# Patient Record
Sex: Male | Born: 1960 | Race: Black or African American | Hispanic: No | Marital: Single | Smoking: Never smoker
Health system: Southern US, Community
[De-identification: ages and names within clinical notes are randomized; demographics above are authoritative.]

## PROBLEM LIST (undated history)

## (undated) DIAGNOSIS — I10 Essential (primary) hypertension: Secondary | ICD-10-CM

## (undated) DIAGNOSIS — I2510 Atherosclerotic heart disease of native coronary artery without angina pectoris: Secondary | ICD-10-CM

## (undated) DIAGNOSIS — G4730 Sleep apnea, unspecified: Secondary | ICD-10-CM

## (undated) DIAGNOSIS — I509 Heart failure, unspecified: Secondary | ICD-10-CM

## (undated) HISTORY — PX: CARDIAC DEFIBRILLATOR PLACEMENT: SHX171

---

## 2012-08-12 ENCOUNTER — Emergency Department (HOSPITAL_BASED_OUTPATIENT_CLINIC_OR_DEPARTMENT_OTHER): Payer: Self-pay

## 2012-08-12 ENCOUNTER — Emergency Department (HOSPITAL_BASED_OUTPATIENT_CLINIC_OR_DEPARTMENT_OTHER)
Admission: EM | Admit: 2012-08-12 | Discharge: 2012-08-12 | Disposition: A | Discharge status: Home / Self Care | Payer: Self-pay | Attending: Emergency Medicine | Admitting: Emergency Medicine

## 2012-08-12 ENCOUNTER — Encounter (HOSPITAL_BASED_OUTPATIENT_CLINIC_OR_DEPARTMENT_OTHER): Payer: Self-pay | Admitting: *Deleted

## 2012-08-12 DIAGNOSIS — R05 Cough: Secondary | ICD-10-CM | POA: Insufficient documentation

## 2012-08-12 DIAGNOSIS — R0602 Shortness of breath: Secondary | ICD-10-CM | POA: Insufficient documentation

## 2012-08-12 DIAGNOSIS — I10 Essential (primary) hypertension: Secondary | ICD-10-CM | POA: Insufficient documentation

## 2012-08-12 DIAGNOSIS — Z95810 Presence of automatic (implantable) cardiac defibrillator: Secondary | ICD-10-CM | POA: Insufficient documentation

## 2012-08-12 DIAGNOSIS — R059 Cough, unspecified: Secondary | ICD-10-CM | POA: Insufficient documentation

## 2012-08-12 DIAGNOSIS — E119 Type 2 diabetes mellitus without complications: Secondary | ICD-10-CM | POA: Insufficient documentation

## 2012-08-12 DIAGNOSIS — I2510 Atherosclerotic heart disease of native coronary artery without angina pectoris: Secondary | ICD-10-CM | POA: Insufficient documentation

## 2012-08-12 DIAGNOSIS — Z794 Long term (current) use of insulin: Secondary | ICD-10-CM | POA: Insufficient documentation

## 2012-08-12 DIAGNOSIS — J40 Bronchitis, not specified as acute or chronic: Secondary | ICD-10-CM | POA: Insufficient documentation

## 2012-08-12 HISTORY — DX: Essential (primary) hypertension: I10

## 2012-08-12 HISTORY — DX: Sleep apnea, unspecified: G47.30

## 2012-08-12 HISTORY — DX: Atherosclerotic heart disease of native coronary artery without angina pectoris: I25.10

## 2012-08-12 HISTORY — DX: Heart failure, unspecified: I50.9

## 2012-08-12 LAB — BASIC METABOLIC PANEL
CO2: 27 mEq/L (ref 19–32)
Calcium: 9.2 mg/dL (ref 8.4–10.5)
Chloride: 100 mEq/L (ref 96–112)
GFR calc Af Amer: >90 mL/min (ref >90)
Sodium: 138 mEq/L (ref 135–145)

## 2012-08-12 LAB — CBC WITH DIFFERENTIAL/PLATELET
Basophils Absolute: 0 10*3/uL (ref 0.0–0.1)
Basophils Relative: 0 % (ref 0–1)
Lymphocytes Relative: 21 % (ref 12–46)
MCHC: 34.6 g/dL (ref 30.0–36.0)
Neutro Abs: 6.4 10*3/uL (ref 1.7–7.7)
Platelets: 259 10*3/uL (ref 150–400)
RDW: 12.9 % (ref 11.5–15.5)
WBC: 9.7 10*3/uL (ref 4.0–10.5)

## 2012-08-12 LAB — PRO B NATRIURETIC PEPTIDE: Pro B Natriuretic peptide (BNP): 873.4 pg/mL — ABNORMAL HIGH (ref 0–125)

## 2012-08-12 LAB — TROPONIN I: Troponin I: <0.3 ng/mL (ref <0.30)

## 2012-08-12 MED ORDER — AMOXICILLIN 500 MG PO CAPS: 1000.0000 mg | ORAL_CAPSULE | Freq: Two times a day (BID) | ORAL | Status: AC

## 2012-08-12 MED ORDER — AMOXICILLIN 500 MG PO CAPS
1000.0000 mg | ORAL_CAPSULE | Freq: Once | ORAL | Status: AC
  Administered 2012-08-12: 1000 mg via ORAL
  Filled 2012-08-12: qty 2

## 2012-08-12 MED ORDER — PREDNISONE 20 MG PO TABS: 60.0000 mg | ORAL_TABLET | Freq: Every day | ORAL | Status: AC

## 2012-08-12 MED ORDER — ALBUTEROL SULFATE (5 MG/ML) 0.5% IN NEBU
5.0000 mg | INHALATION_SOLUTION | Freq: Once | RESPIRATORY_TRACT | Status: AC
  Administered 2012-08-12: 5 mg via RESPIRATORY_TRACT
  Filled 2012-08-12: qty 1

## 2012-08-12 MED ORDER — IPRATROPIUM BROMIDE 0.02 % IN SOLN
0.5000 mg | Freq: Once | RESPIRATORY_TRACT | Status: AC
  Administered 2012-08-12: 0.5 mg via RESPIRATORY_TRACT
  Filled 2012-08-12: qty 2.5

## 2012-08-12 MED ORDER — PREDNISONE 50 MG PO TABS
60.0000 mg | ORAL_TABLET | Freq: Once | ORAL | Status: AC
  Administered 2012-08-12: 60 mg via ORAL
  Filled 2012-08-12: qty 1

## 2012-08-12 MED ORDER — ALBUTEROL SULFATE HFA 108 (90 BASE) MCG/ACT IN AERS
2.0000 | INHALATION_SPRAY | RESPIRATORY_TRACT | Status: DC | PRN
  Administered 2012-08-12: 2 via RESPIRATORY_TRACT
  Filled 2012-08-12: qty 6.7

## 2012-08-12 NOTE — ED Notes (Signed)
2 weeks of cough shortness of breath has to sleep sitting up

## 2012-08-12 NOTE — ED Provider Notes (Signed)
History     CSN: [id number]  Arrival date & time 08/12/12  4098   First MD Initiated Contact with Patient 08/12/12 0913      Chief Complaint  Patient presents with  . Cough  . Shortness of Breath    (Consider location/radiation/quality/duration/timing/severity/associated sxs/prior treatment) Patient is a 50 y.o. male presenting with cough and shortness of breath. The history is provided by the patient.  Cough Associated symptoms include shortness of breath.  Shortness of Breath  Associated symptoms include cough and shortness of breath.  He has had a productive cough for the last 2 weeks. Cough is productive of green to yellow sputum. There has been no associated fever or sweats but he has had chills. There's been associated dyspnea. He has had some chest soreness but no true chest pain, heaviness, tightness, pressure. Dyspnea is worse with the coughing paroxysms, but he has not noticed any exertional dyspnea. He has not had any orthopnea, but he states that when he lays down he can only sleep if he is on his stomach. He's tried treating himself with Mucinex with no relief. Symptoms are described as severe.  Past Medical History  Diagnosis Date  . Hypertension   . Diabetes mellitus   . Coronary artery disease   . Congestive heart failure   . Sleep apnea     Past Surgical History  Procedure Date  . Cardiac defibrillator placement   . Cardiac defibrillator placement     History reviewed. No pertinent family history.  History  Substance Use Topics  . Smoking status: Never Smoker   . Smokeless tobacco: Not on file  . Alcohol Use: No      Review of Systems  Respiratory: Positive for cough and shortness of breath.   All other systems reviewed and are negative.    Allergies  Shellfish allergy  Home Medications   Current Outpatient Rx  Name Route Sig Dispense Refill  . ASPIRIN 81 MG PO TABS Oral Take 81 mg by mouth daily.    . ATORVASTATIN CALCIUM 80 MG PO TABS  Oral Take 80 mg by mouth daily.    Marland Kitchen DIGOXIN 0.25 MG PO TABS Oral Take 0.25 mg by mouth daily.    . FUROSEMIDE 40 MG PO TABS Oral Take 40 mg by mouth daily.    Marland Kitchen GLIPIZIDE 5 MG PO TABS Oral Take 5 mg by mouth 2 (two) times daily before a meal.    . GUAIFENESIN ER 600 MG PO TB12 Oral Take 1,200 mg by mouth 2 (two) times daily.    . INSULIN GLARGINE 100 UNIT/ML Jemison SOLN Subcutaneous Inject into the skin at bedtime.    Marland Kitchen METFORMIN HCL 1000 MG PO TABS Oral Take 1,000 mg by mouth 2 (two) times daily with a meal.    . METOPROLOL SUCCINATE ER 100 MG PO TB24 Oral Take 100 mg by mouth daily. Take with or immediately following a meal.    . SPIRONOLACTONE 25 MG PO TABS Oral Take 25 mg by mouth daily.      BP 125/63  Pulse 93  Temp 97.9 F (36.6 C) (Oral)  Resp 24  SpO2 100%  Physical Exam  Nursing note and vitals reviewed. 51year old male, resting comfortably and in no acute distress. Vital signs are significant for tachypnea with respiratory rate 24. Oxygen saturation is 100%, which is normal. Head is normocephalic and atraumatic. PERRLA, EOMI. Oropharynx is clear. Neck is nontender and supple without adenopathy or JVD. Back is nontender and  there is no CVA tenderness. Lungs have rales at the left base, and also have diffuse inspiratory and expiratory wheezes. Chest is nontender. Pacemaker-defibrillator is present in the left subclavian area Heart has regular rate and rhythm without murmur. Abdomen is soft, flat, nontender without masses or hepatosplenomegaly and peristalsis is normoactive. Extremities have no cyanosis or edema, full range of motion is present. Skin is warm and dry without rash. Neurologic: Mental status is normal, cranial nerves are intact, there are no motor or sensory deficits.   ED Course  Procedures (including critical care time)  Results for orders placed during the hospital encounter of 08/12/12  CBC WITH DIFFERENTIAL      Component Value Range   WBC 9.7  4.0 -  10.5 K/uL   RBC 4.42  4.22 - 5.81 MIL/uL   Hemoglobin 12.8 (*) 13.0 - 17.0 g/dL   HCT 40.9 (*) 81.1 - 91.4 %   MCV 83.7  78.0 - 100.0 fL   MCH 29.0  26.0 - 34.0 pg   MCHC 34.6  30.0 - 36.0 g/dL   RDW 78.2  95.6 - 21.3 %   Platelets 259  150 - 400 K/uL   Neutrophils Relative 66  43 - 77 %   Neutro Abs 6.4  1.7 - 7.7 K/uL   Lymphocytes Relative 21  12 - 46 %   Lymphs Abs 2.0  0.7 - 4.0 K/uL   Monocytes Relative 10  3 - 12 %   Monocytes Absolute 1.0  0.1 - 1.0 K/uL   Eosinophils Relative 4  0 - 5 %   Eosinophils Absolute 0.3  0.0 - 0.7 K/uL   Basophils Relative 0  0 - 1 %   Basophils Absolute 0.0  0.0 - 0.1 K/uL  BASIC METABOLIC PANEL      Component Value Range   Sodium 138  135 - 145 mEq/L   Potassium 3.5  3.5 - 5.1 mEq/L   Chloride 100  96 - 112 mEq/L   CO2 27  19 - 32 mEq/L   Glucose, Bld 114 (*) 70 - 99 mg/dL   BUN 13  6 - 23 mg/dL   Creatinine, Ser 0.86  0.50 - 1.35 mg/dL   Calcium 9.2  8.4 - 57.8 mg/dL   GFR calc non Af Amer >90  >90 mL/min   GFR calc Af Amer >90  >90 mL/min  PRO B NATRIURETIC PEPTIDE      Component Value Range   Pro B Natriuretic peptide (BNP) 873.4 (*) 0 - 125 pg/mL  TROPONIN I      Component Value Range   Troponin I <0.30  <0.30 ng/mL   Dg Chest 2 View  08/12/2012  *RADIOLOGY REPORT*  Clinical Data: Cough.  Chills.  Shortness of breath.  History of hypertension, diabetes, coronary artery disease, sleep apnea.  CHEST - 2 VIEW  Comparison: None.  Findings: Cardiac silhouette moderately enlarged.  Thoracic aorta minimally atherosclerotic.  Hilar and mediastinal contours otherwise unremarkable.  Left subclavian pacing defibrillator with lead tips at the expected location of the right atrial appendage and right ventricular apex.  Lungs clear.  Bronchovascular markings normal.  Pulmonary vascularity normal.  No pneumothorax.  No pleural effusions.  Visualized bony thorax intact.  IMPRESSION: Cardiomegaly.  No acute cardiopulmonary disease.   Original Report  Authenticated By: Arnell Sieving, M.D.       Date: 08/12/2012  Rate: 97  Rhythm: normal sinus rhythm  QRS Axis: left  Intervals: normal  ST/T Wave  abnormalities: J-point elevation in the anterior leads  Conduction Disutrbances:nonspecific intraventricular conduction delay  Narrative Interpretation: Left ventricular hypertrophy with QRS widening and repolarization abnormality. Old anteroseptal myocardial infarction. Left axis deviation. No prior ECG available for comparison.  Old EKG Reviewed: none available    1. Bronchitis       MDM  Bronchitis versus pneumonia. Given his history of congestive heart failure, ECG and BNP will be checked. He states that his ejection fraction is 23%. Chest x-ray or be obtained to rule out pneumonia. He will be given albuterol with Atrovent and reassessed.  1015: Reexam after albuterol with Atrovent. He states subjective improvement. On exam, wheezing has decreased slightly but is still present. Albuterol with Atrovent will be repeated. Chest x-ray does not show evidence of infiltrate.  1110: Reexam after second nebulizer treatment. He has noted significant additional subjective improvement. On exam, there are still very faint, wheezes or present. You'll be given a third albuterol with Atrovent treatment.  1210: Reexam after third nebulizer treatment shows completely clear lungs. His blood sugar has come back is slightly over 100, so I feel that he can tolerate a short course of prednisone. He will be sent home with prescriptions for prednisone, amoxicillin, and he is given an albuterol inhaler to take home. [facility].  Dione Booze, MD 08/12/12 1220

## 2012-08-12 NOTE — ED Notes (Signed)
Patient transported to X-ray 

## 2016-11-06 ENCOUNTER — Other Ambulatory Visit: Payer: Self-pay | Admitting: Orthopaedic Surgery

## 2016-11-06 DIAGNOSIS — M5416 Radiculopathy, lumbar region: Secondary | ICD-10-CM

## 2016-11-18 ENCOUNTER — Other Ambulatory Visit: Payer: Self-pay | Admitting: Orthopaedic Surgery

## 2016-11-18 DIAGNOSIS — M5416 Radiculopathy, lumbar region: Secondary | ICD-10-CM

## 2016-11-19 ENCOUNTER — Ambulatory Visit
Admission: RE | Admit: 2016-11-19 | Discharge: 2016-11-19 | Disposition: A | Discharge status: Home / Self Care | Payer: Worker's Compensation | Source: Ambulatory Visit | Attending: Orthopaedic Surgery | Admitting: Orthopaedic Surgery

## 2016-11-19 DIAGNOSIS — M5416 Radiculopathy, lumbar region: Secondary | ICD-10-CM

## 2017-11-11 IMAGING — CT CT L SPINE W/O CM
1 of 6 series · 6 of 14 positions shown, 8 images · non-contrast
Comparison: None.

CLINICAL DATA: Lumbar radiculopathy

EXAM:
CT LUMBAR SPINE WITHOUT CONTRAST
TECHNIQUE: Multidetector CT imaging of the lumbar spine was performed without
intravenous contrast administration. Multiplanar CT image
reconstructions were also generated.

[Series 3: l spine soft · axial · 0.30mm/px · z∈[+856,+1036]mm · 6 of 85 slices shown, 8 images]
[im 13/85  soft-tissue]
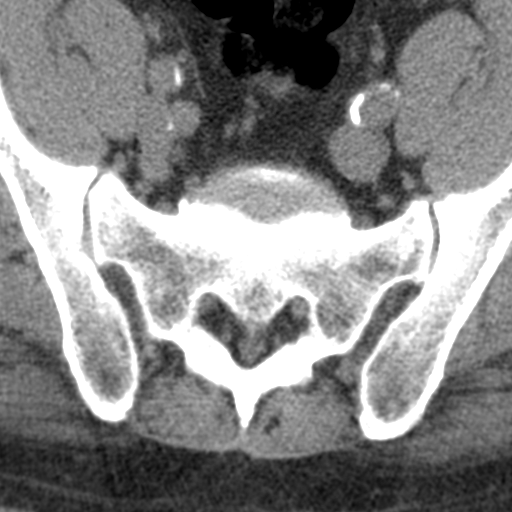
[im 13/85  bone]
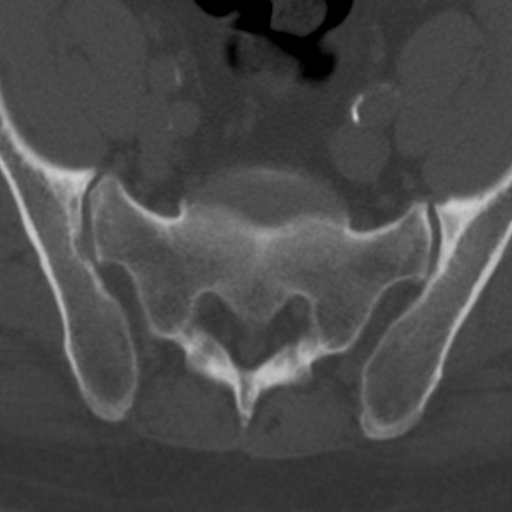
[im 25/85  bone]
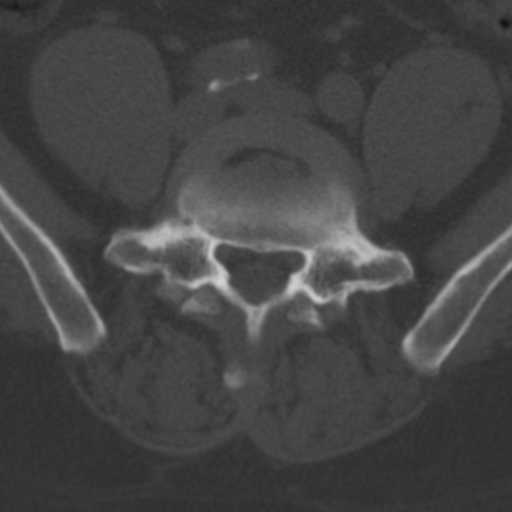
[im 37/85  bone]
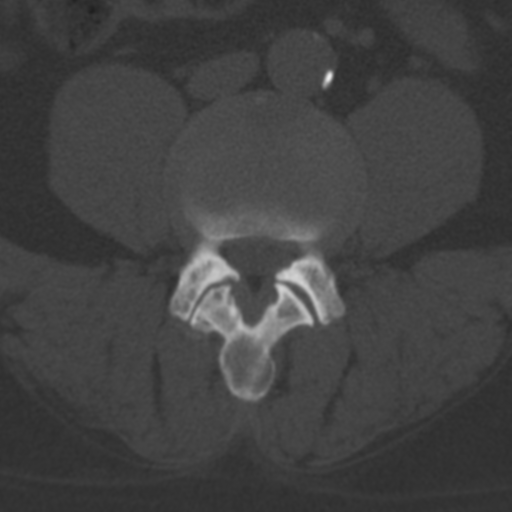
[im 49/85  bone]
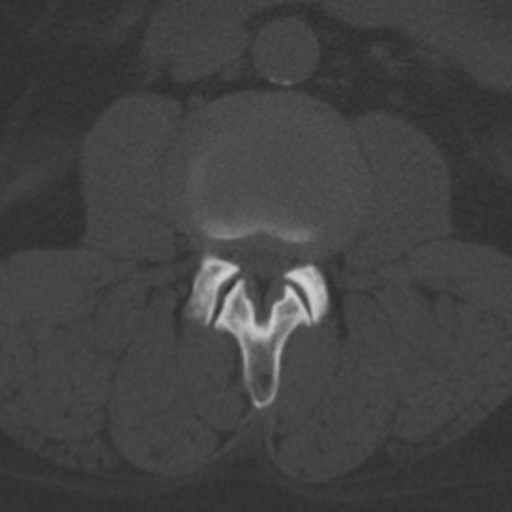
[im 61/85  soft-tissue]
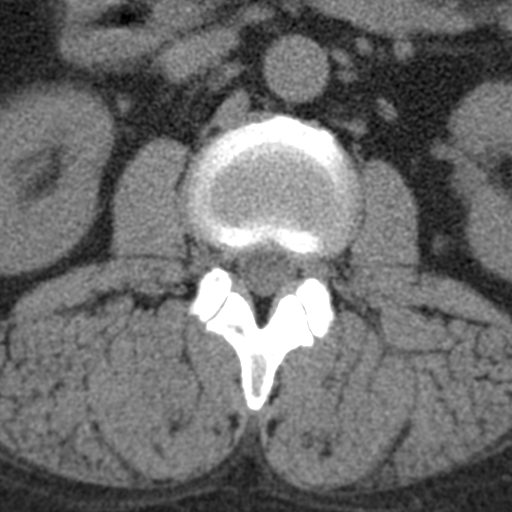
[im 61/85  bone]
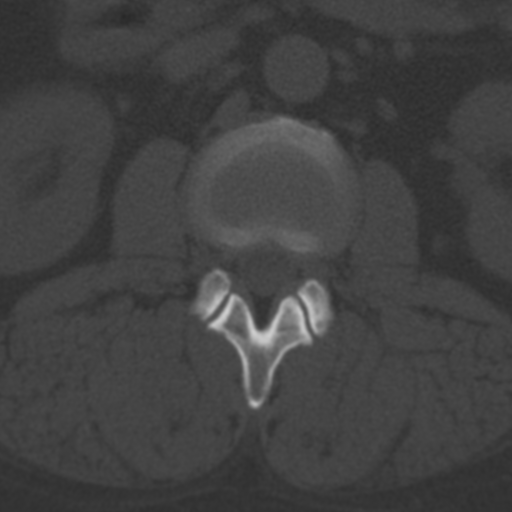
[im 73/85  bone]
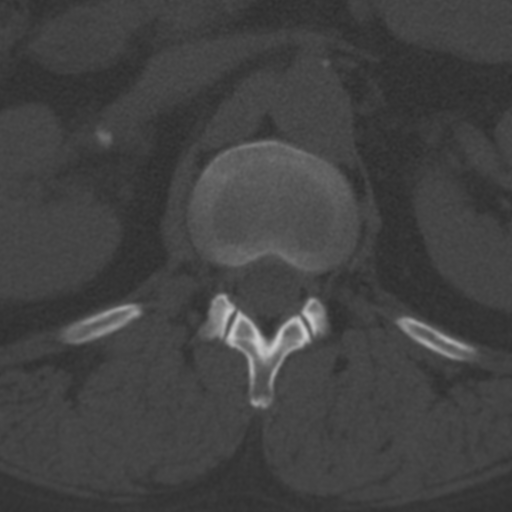

[6 of 14 positions shown; findings below may reference images not displayed]

FINDINGS: Segmentation: Normal

Alignment: Mild retrolisthesis L4-5.  Remaining alignment normal.

Vertebrae: Negative for fracture or mass.

Paraspinal and other soft tissues: Paraspinous muscles normal. No
retroperitoneal mass or adenopathy. Atherosclerotic calcification in
the aorta and iliac arteries without aneurysm. Mild atherosclerotic
calcification in the renal artery bilaterally.

Disc levels: L1-2:  Mild disc degeneration without stenosis

L2-3: Moderate disc bulging and moderate facet hypertrophy. Moderate
spinal stenosis. Neural foramina patent.

L3-4: Moderate diffuse bulging of the disc. Moderate facet and
ligamentum flavum hypertrophy causing moderate spinal stenosis.
Neural foramina adequately patent

L4-5: Disc degeneration. Diffuse disc bulging with endplate
spurring. Mild facet degeneration. Moderate spinal stenosis.
Subarticular stenosis bilaterally.

L5-S1:  Negative
IMPRESSION: Disc and facet degeneration causing moderate spinal stenosis at
L2-3, L3-4, L4-5.

## 2019-01-01 ENCOUNTER — Other Ambulatory Visit: Payer: Self-pay

## 2019-01-01 ENCOUNTER — Emergency Department (HOSPITAL_COMMUNITY): Payer: No Typology Code available for payment source

## 2019-01-01 ENCOUNTER — Encounter (HOSPITAL_COMMUNITY): Payer: Self-pay

## 2019-01-01 ENCOUNTER — Emergency Department (HOSPITAL_COMMUNITY)
Admission: EM | Admit: 2019-01-01 | Discharge: 2019-01-01 | Disposition: A | Discharge status: Home / Self Care | Payer: No Typology Code available for payment source | Attending: Emergency Medicine | Admitting: Emergency Medicine

## 2019-01-01 DIAGNOSIS — I509 Heart failure, unspecified: Secondary | ICD-10-CM | POA: Insufficient documentation

## 2019-01-01 DIAGNOSIS — E875 Hyperkalemia: Secondary | ICD-10-CM | POA: Insufficient documentation

## 2019-01-01 DIAGNOSIS — E119 Type 2 diabetes mellitus without complications: Secondary | ICD-10-CM | POA: Insufficient documentation

## 2019-01-01 DIAGNOSIS — Z794 Long term (current) use of insulin: Secondary | ICD-10-CM | POA: Insufficient documentation

## 2019-01-01 DIAGNOSIS — I110 Hypertensive heart disease with heart failure: Secondary | ICD-10-CM | POA: Insufficient documentation

## 2019-01-01 DIAGNOSIS — Z79899 Other long term (current) drug therapy: Secondary | ICD-10-CM | POA: Insufficient documentation

## 2019-01-01 DIAGNOSIS — I2510 Atherosclerotic heart disease of native coronary artery without angina pectoris: Secondary | ICD-10-CM | POA: Insufficient documentation

## 2019-01-01 DIAGNOSIS — Z7982 Long term (current) use of aspirin: Secondary | ICD-10-CM | POA: Insufficient documentation

## 2019-01-01 DIAGNOSIS — R0602 Shortness of breath: Secondary | ICD-10-CM

## 2019-01-01 LAB — CBC WITH DIFFERENTIAL/PLATELET
Abs Immature Granulocytes: 0.04 10*3/uL (ref 0.00–0.07)
Basophils Absolute: 0.1 10*3/uL (ref 0.0–0.1)
Basophils Relative: 1 %
Eosinophils Absolute: 0.7 10*3/uL — ABNORMAL HIGH (ref 0.0–0.5)
Eosinophils Relative: 7 %
HCT: 54.4 % — ABNORMAL HIGH (ref 39.0–52.0)
Hemoglobin: 17.1 g/dL — ABNORMAL HIGH (ref 13.0–17.0)
Immature Granulocytes: 1 %
LYMPHS PCT: 18 %
Lymphs Abs: 1.6 10*3/uL (ref 0.7–4.0)
MCH: 29.4 pg (ref 26.0–34.0)
MCHC: 31.4 g/dL (ref 30.0–36.0)
MCV: 93.6 fL (ref 80.0–100.0)
Monocytes Absolute: 0.8 10*3/uL (ref 0.1–1.0)
Monocytes Relative: 9 %
NEUTROS PCT: 64 %
Neutro Abs: 5.7 10*3/uL (ref 1.7–7.7)
Platelets: 146 10*3/uL — ABNORMAL LOW (ref 150–400)
RBC: 5.81 MIL/uL (ref 4.22–5.81)
RDW: 17.2 % — ABNORMAL HIGH (ref 11.5–15.5)
WBC: 8.9 10*3/uL (ref 4.0–10.5)
nRBC: 0 % (ref 0.0–0.2)

## 2019-01-01 LAB — COMPREHENSIVE METABOLIC PANEL
ALT: 50 U/L — AB (ref 0–44)
AST: 129 U/L — ABNORMAL HIGH (ref 15–41)
Albumin: 2.7 g/dL — ABNORMAL LOW (ref 3.5–5.0)
Alkaline Phosphatase: 102 U/L (ref 38–126)
Anion gap: 12 (ref 5–15)
BUN: 57 mg/dL — ABNORMAL HIGH (ref 6–20)
CO2: 25 mmol/L (ref 22–32)
Calcium: 7.7 mg/dL — ABNORMAL LOW (ref 8.9–10.3)
Chloride: 99 mmol/L (ref 98–111)
Creatinine, Ser: 2.27 mg/dL — ABNORMAL HIGH (ref 0.61–1.24)
GFR calc Af Amer: 36 mL/min — ABNORMAL LOW (ref >60)
GFR calc non Af Amer: 31 mL/min — ABNORMAL LOW (ref >60)
GLUCOSE: 55 mg/dL — AB (ref 70–99)
Potassium: 7.4 mmol/L (ref 3.5–5.1)
Sodium: 136 mmol/L (ref 135–145)
Total Bilirubin: 2.6 mg/dL — ABNORMAL HIGH (ref 0.3–1.2)
Total Protein: 5.9 g/dL — ABNORMAL LOW (ref 6.5–8.1)

## 2019-01-01 LAB — I-STAT TROPONIN, ED: Troponin i, poc: 0.01 ng/mL (ref 0.00–0.08)

## 2019-01-01 LAB — POTASSIUM: POTASSIUM: 3.4 mmol/L — AB (ref 3.5–5.1)

## 2019-01-01 LAB — BRAIN NATRIURETIC PEPTIDE: B Natriuretic Peptide: 606.3 pg/mL — ABNORMAL HIGH (ref 0.0–100.0)

## 2019-01-01 LAB — CBG MONITORING, ED: Glucose-Capillary: 67 mg/dL — ABNORMAL LOW (ref 70–99)

## 2019-01-01 MED ORDER — DEXTROSE 50 % IV SOLN
1.0000 | Freq: Once | INTRAVENOUS | Status: AC
  Administered 2019-01-01: 50 mL via INTRAVENOUS
  Filled 2019-01-01: qty 50

## 2019-01-01 MED ORDER — SODIUM BICARBONATE 8.4 % IV SOLN
50.0000 meq | Freq: Once | INTRAVENOUS | Status: AC
  Administered 2019-01-01: 50 meq via INTRAVENOUS
  Filled 2019-01-01: qty 50

## 2019-01-01 MED ORDER — IPRATROPIUM-ALBUTEROL 0.5-2.5 (3) MG/3ML IN SOLN
3.0000 mL | Freq: Once | RESPIRATORY_TRACT | Status: AC
  Administered 2019-01-01: 3 mL via RESPIRATORY_TRACT
  Filled 2019-01-01: qty 3

## 2019-01-01 MED ORDER — INSULIN ASPART 100 UNIT/ML IV SOLN
5.0000 [IU] | Freq: Once | INTRAVENOUS | Status: AC
  Administered 2019-01-01: 5 [IU] via INTRAVENOUS

## 2019-01-01 MED ORDER — CALCIUM GLUCONATE-NACL 1-0.675 GM/50ML-% IV SOLN
1.0000 g | Freq: Once | INTRAVENOUS | Status: AC
  Administered 2019-01-01: 1000 mg via INTRAVENOUS
  Filled 2019-01-01: qty 50

## 2019-01-01 MED ORDER — ALBUTEROL SULFATE (2.5 MG/3ML) 0.083% IN NEBU
10.0000 mg | INHALATION_SOLUTION | Freq: Once | RESPIRATORY_TRACT | Status: AC
  Administered 2019-01-01: 10 mg via RESPIRATORY_TRACT
  Filled 2019-01-01: qty 12

## 2019-01-01 NOTE — ED Notes (Signed)
Ambulated pt while monitoring pulse oximetry. SpO2 stayed between 96% and 98%. Pt tolerated well.

## 2019-01-01 NOTE — ED Provider Notes (Signed)
[facility] EMERGENCY DEPARTMENT Provider Note   CSN: [id number] Arrival date & time: 01/01/19  1351     History   Chief Complaint Chief Complaint  Patient presents with  . Shortness of Breath  . Cough    HPI Albert Romero is a 58 y.o. male.  HPI Patient states the last couple of days the patient has had trouble with diarrhea.  Patient states he has had a couple episodes per day.  He also started to feel chilled and felt feverish although he did not measure a temperature.  Patient has been coughing and has felt lightheaded.  He feels like his congestive heart failure is acting up and he is retaining fluid.  He has noticed swelling in his legs.  He does feel short of breath with activity.  He denies any chest pain.  No abdominal pain.  No headache.  No nausea or vomiting. Past Medical History:  Diagnosis Date  . Congestive heart failure (HCC)   . Coronary artery disease   . Diabetes mellitus   . Hypertension   . Sleep apnea     There are no active problems to display for this patient.   Past Surgical History:  Procedure Laterality Date  . CARDIAC DEFIBRILLATOR PLACEMENT    . CARDIAC DEFIBRILLATOR PLACEMENT          Home Medications    Prior to Admission medications   Medication Sig Start Date End Date Taking? Authorizing Provider  amoxicillin (AMOXIL) 500 MG capsule Take 2 capsules (1,000 mg total) by mouth 2 (two) times daily. 08/12/12   Dione Booze, MD  aspirin 81 MG tablet Take 81 mg by mouth daily.    [provider]  atorvastatin (LIPITOR) 80 MG tablet Take 80 mg by mouth daily.    [provider]  digoxin (LANOXIN) 0.25 MG tablet Take 0.25 mg by mouth daily.    [provider]  furosemide (LASIX) 40 MG tablet Take 40 mg by mouth daily.    [provider]  glipiZIDE (GLUCOTROL) 5 MG tablet Take 5 mg by mouth 2 (two) times daily before a meal.    [provider]  guaiFENesin (MUCINEX) 600 MG 12 hr  tablet Take 1,200 mg by mouth 2 (two) times daily.    [provider]  insulin glargine (LANTUS) 100 UNIT/ML injection Inject into the skin at bedtime.    [provider]  metFORMIN (GLUCOPHAGE) 1000 MG tablet Take 1,000 mg by mouth 2 (two) times daily with a meal.    [provider]  metoprolol succinate (TOPROL-XL) 100 MG 24 hr tablet Take 100 mg by mouth daily. Take with or immediately following a meal.    [provider]  predniSONE (DELTASONE) 20 MG tablet Take 3 tablets (60 mg total) by mouth daily. 08/12/12   Dione Booze, MD  spironolactone (ALDACTONE) 25 MG tablet Take 25 mg by mouth daily.    [provider]    Family History History reviewed. No pertinent family history.  Social History Social History   Tobacco Use  . Smoking status: Never Smoker  Substance Use Topics  . Alcohol use: No  . Drug use: No     Allergies   Shellfish allergy   Review of Systems Review of Systems  All other systems reviewed and are negative.    Physical Exam Updated Vital Signs BP 121/84   Pulse 60   Temp (!) 97.5 F (36.4 C) (Oral)   Resp 17  Ht 1.854 m (6\' 1" )   SpO2 96%   Physical Exam Vitals signs and nursing note reviewed.  Constitutional:      General: He is not in acute distress.    Appearance: He is well-developed.  HENT:     Head: Normocephalic and atraumatic.     Right Ear: External ear normal.     Left Ear: External ear normal.  Eyes:     General: No scleral icterus.       Right eye: No discharge.        Left eye: No discharge.     Conjunctiva/sclera: Conjunctivae normal.  Neck:     Musculoskeletal: Neck supple.     Trachea: No tracheal deviation.  Cardiovascular:     Rate and Rhythm: Normal rate and regular rhythm.  Pulmonary:     Effort: Pulmonary effort is normal. No respiratory distress.     Breath sounds: No stridor. Wheezing present. No rales.  Abdominal:     General: Bowel sounds are normal. There is no  distension.     Palpations: Abdomen is soft.     Tenderness: There is no abdominal tenderness. There is no guarding or rebound.  Musculoskeletal:        General: No tenderness.     Right lower leg: Edema present.     Left lower leg: Edema present.  Skin:    General: Skin is warm and dry.     Findings: No rash.  Neurological:     Mental Status: He is alert.     Cranial Nerves: No cranial nerve deficit (no facial droop, extraocular movements intact, no slurred speech).     Sensory: No sensory deficit.     Motor: No abnormal muscle tone or seizure activity.     Coordination: Coordination normal.      ED Treatments / Results  Labs (all labs ordered are listed, but only abnormal results are displayed) Labs Reviewed  COMPREHENSIVE METABOLIC PANEL - Abnormal; Notable for the following components:      Result Value   Potassium 7.4 (*)    Glucose, Bld 55 (*)    BUN 57 (*)    Creatinine, Ser 2.27 (*)    Calcium 7.7 (*)    Total Protein 5.9 (*)    Albumin 2.7 (*)    AST 129 (*)    ALT 50 (*)    Total Bilirubin 2.6 (*)    GFR calc non Af Amer 31 (*)    GFR calc Af Amer 36 (*)    All other components within normal limits  CBC WITH DIFFERENTIAL/PLATELET - Abnormal; Notable for the following components:   Hemoglobin 17.1 (*)    HCT 54.4 (*)    RDW 17.2 (*)    Platelets 146 (*)    Eosinophils Absolute 0.7 (*)    All other components within normal limits  BRAIN NATRIURETIC PEPTIDE - Abnormal; Notable for the following components:   B Natriuretic Peptide 606.3 (*)    All other components within normal limits  POTASSIUM  I-STAT TROPONIN, ED    EKG EKG Interpretation  Date/Time:  Sunday January 01 2019 13:57:27 EST Ventricular Rate:  90 PR Interval:    QRS Duration: 158 QT Interval:  440 QTC Calculation: 538 R Axis:   -76 Text Interpretation:  Undetermined rhythm Left axis deviation Left bundle branch block Abnormal ECG Confirmed by Linwood Dibbles [phone]) on 01/01/2019 2:15:17  PM   Radiology No results found.  Procedures .Critical Care Performed by: Lynelle Doctor,  Cletis AthensJon, MD Authorized by: Linwood DibblesKnapp, Ralphine Hinks, MD   Critical care provider statement:    Critical care time (minutes):  45   Critical care was time spent personally by me on the following activities:  Discussions with consultants, evaluation of patient's response to treatment, examination of patient, ordering and performing treatments and interventions, ordering and review of laboratory studies, ordering and review of radiographic studies, pulse oximetry, re-evaluation of patient's condition, obtaining history from patient or surrogate and review of old charts   (including critical care time)  Medications Ordered in ED Medications  ipratropium-albuterol (DUONEB) 0.5-2.5 (3) MG/3ML nebulizer solution 3 mL (3 mLs Nebulization Given 01/01/19 1455)  albuterol (PROVENTIL) (2.5 MG/3ML) 0.083% nebulizer solution 10 mg (10 mg Nebulization Given 01/01/19 1541)  insulin aspart (novoLOG) injection 5 Units (5 Units Intravenous Given 01/01/19 1529)    And  dextrose 50 % solution 50 mL (50 mLs Intravenous Given 01/01/19 1525)  sodium bicarbonate injection 50 mEq (50 mEq Intravenous Given 01/01/19 1526)  calcium gluconate 1 g/ 50 mL sodium chloride IVPB (0 g Intravenous Stopped 01/01/19 1603)     Initial Impression / Assessment and Plan / ED Course  I have reviewed the triage vital signs and the nursing notes.  Pertinent labs & imaging results that were available during my care of the patient were reviewed by me and considered in my medical decision making (see chart for details).  Clinical Course as of Jan 01 1606  Wynelle LinkSun Jan 01, 2019  1515 Laboratory test showing a potassium of 7.4 associated with elevated BUN and creatinine   [JK]  1515 No peaked T waves on EKG but this may be a true value.  Plan on repeating   [JK]  1518  75/ 2.58mg /dl on 9/6/041/3/19.  BUN and creatinine are chronically elevated compared to labs available in care  everywhere however potassium is significantly elevated   [JK]    Clinical Course User Index [JK] Linwood DibblesKnapp, Alaine Loughney, MD    Labs notable for evaded potassium.  Patient's BUN and creatinine are elevated but this is change from his baseline.  BNP is slightly elevated.  Chest x-ray is pending.  I will treat empirically for hyperkalemia and repeat his potassium level.  Possible this could be hemolysis.  Pt remains stable. WIll turn over care to Dr Jeraldine LootsLockwood  Final Clinical Impressions(s) / ED Diagnoses   Final diagnoses:  Hyperkalemia      Linwood DibblesKnapp, Seleta Hovland, MD 01/01/19 [phone]

## 2019-01-01 NOTE — ED Provider Notes (Addendum)
Patient's repeat potassium value is normal, and on repeat exam he has no complaints, stating that he feels good. We discussed results thus far including initial hyperkalemia, though with otherwise somewhat reassuring results, consistent with prior studies as an outpatient. Now with no ongoing complaints, no pain, the patient has ambulated, with no hypoxia, no pain, no increased work of breathing. With normalization of his electrolytes, no ongoing complaints, the patient will follow closely with his outpatient providers.  Gerhard MunchLockwood, Cami Delawder, MD 01/01/19 2043    Gerhard MunchLockwood, Olanrewaju Osborn, MD 01/01/19 2045

## 2019-01-01 NOTE — Discharge Instructions (Addendum)
As discussed, your evaluation today has been largely reassuring.  But, it is important that you monitor your condition carefully, and do not hesitate to return to the ED if you develop new, or concerning changes in your condition.  Otherwise, please follow-up with your physician for appropriate ongoing care. In particular, discussed that his episode and your lab result concerning for hyperkalemia, which has improved after medication provided here. This may require adjustment of your diuretic medication regimen.

## 2019-01-01 NOTE — ED Triage Notes (Signed)
Pt reports shortness of breath and productive cough for about 6 days, hx of CHF. States "I have fallen of the wagon lately". Pt ill appearing in triage. Has ICD/pacemaker in place, states it fired 2 years ago.

## 2019-12-24 IMAGING — DX DG CHEST 1V PORT
1 series · 1 of 1 positions shown · non-contrast
Comparison: 07/19/2017 and earlier.

CLINICAL DATA: 57-year-old male with shortness of breath and
productive cough for 6 days.

EXAM:
PORTABLE CHEST 1 VIEW

[chest]
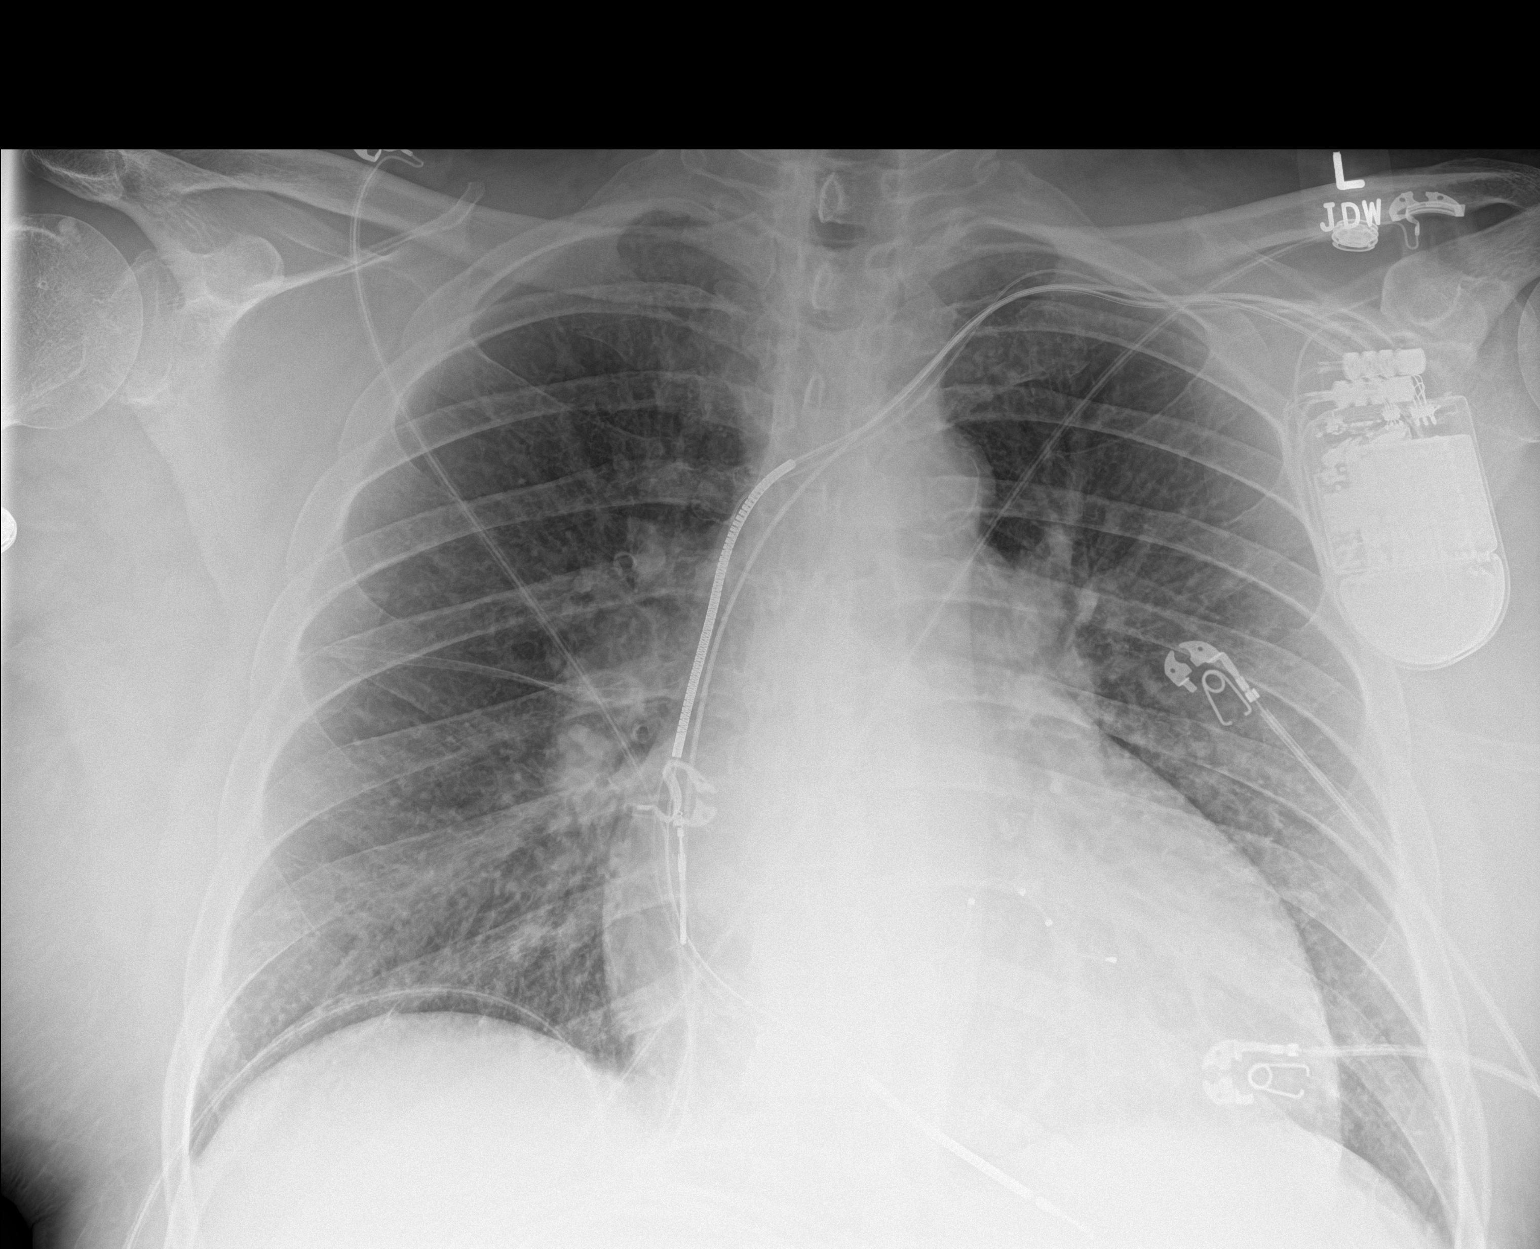

[1 of 1 positions shown; findings below may reference images not displayed]

FINDINGS: Portable AP semi upright view at 9525 hours. Stable cardiomegaly and
mediastinal contours. Stable left chest AICD. Visualized tracheal
air column is within normal limits. Pulmonary vascularity appears
stable. No pneumothorax, pulmonary edema, pleural effusion or
confluent pulmonary opacity.
IMPRESSION: Stable cardiomegaly and mild vascular congestion. No acute
cardiopulmonary abnormality.

## 2021-03-23 DEATH — deceased
